# Patient Record
Sex: Male | Born: 1965 | Race: White | Hispanic: No | Marital: Single | Smoking: Current every day smoker
Health system: Southern US, Community
[De-identification: ages and names within clinical notes are randomized; demographics above are authoritative.]

## PROBLEM LIST (undated history)

## (undated) DIAGNOSIS — A159 Respiratory tuberculosis unspecified: Secondary | ICD-10-CM

---

## 2014-07-04 ENCOUNTER — Emergency Department (HOSPITAL_COMMUNITY)
Admission: EM | Admit: 2014-07-04 | Discharge: 2014-07-04 | Disposition: A | Discharge status: Home / Self Care | Payer: Self-pay | Attending: Emergency Medicine | Admitting: Emergency Medicine

## 2014-07-04 ENCOUNTER — Emergency Department (HOSPITAL_COMMUNITY): Payer: Self-pay

## 2014-07-04 ENCOUNTER — Encounter (HOSPITAL_COMMUNITY): Payer: Self-pay

## 2014-07-04 DIAGNOSIS — Y9301 Activity, walking, marching and hiking: Secondary | ICD-10-CM | POA: Insufficient documentation

## 2014-07-04 DIAGNOSIS — Z720 Tobacco use: Secondary | ICD-10-CM | POA: Insufficient documentation

## 2014-07-04 DIAGNOSIS — Z23 Encounter for immunization: Secondary | ICD-10-CM | POA: Insufficient documentation

## 2014-07-04 DIAGNOSIS — K047 Periapical abscess without sinus: Secondary | ICD-10-CM

## 2014-07-04 DIAGNOSIS — W01198A Fall on same level from slipping, tripping and stumbling with subsequent striking against other object, initial encounter: Secondary | ICD-10-CM | POA: Insufficient documentation

## 2014-07-04 DIAGNOSIS — Z8611 Personal history of tuberculosis: Secondary | ICD-10-CM | POA: Insufficient documentation

## 2014-07-04 DIAGNOSIS — Y92009 Unspecified place in unspecified non-institutional (private) residence as the place of occurrence of the external cause: Secondary | ICD-10-CM | POA: Insufficient documentation

## 2014-07-04 DIAGNOSIS — S01111A Laceration without foreign body of right eyelid and periocular area, initial encounter: Secondary | ICD-10-CM | POA: Insufficient documentation

## 2014-07-04 DIAGNOSIS — Y998 Other external cause status: Secondary | ICD-10-CM | POA: Insufficient documentation

## 2014-07-04 DIAGNOSIS — K046 Periapical abscess with sinus: Secondary | ICD-10-CM | POA: Insufficient documentation

## 2014-07-04 DIAGNOSIS — W002XXA Other fall from one level to another due to ice and snow, initial encounter: Secondary | ICD-10-CM | POA: Insufficient documentation

## 2014-07-04 HISTORY — DX: Respiratory tuberculosis unspecified: A15.9

## 2014-07-04 MED ORDER — PENICILLIN V POTASSIUM 500 MG PO TABS: 500.0000 mg | ORAL_TABLET | Freq: Four times a day (QID) | ORAL | Status: AC

## 2014-07-04 MED ORDER — PENICILLIN V POTASSIUM 500 MG PO TABS: 500.0000 mg | ORAL_TABLET | Freq: Four times a day (QID) | ORAL | Status: DC

## 2014-07-04 MED ORDER — TETRACAINE HCL 0.5 % OP SOLN
1.0000 [drp] | Freq: Once | OPHTHALMIC | Status: AC
  Administered 2014-07-04: 1 [drp] via OPHTHALMIC
  Filled 2014-07-04: qty 2

## 2014-07-04 MED ORDER — LIDOCAINE HCL (PF) 1 % IJ SOLN
5.0000 mL | Freq: Once | INTRAMUSCULAR | Status: AC
  Administered 2014-07-04: 5 mL
  Filled 2014-07-04: qty 5

## 2014-07-04 MED ORDER — BACITRACIN ZINC 500 UNIT/GM EX OINT: 1.0000 "application " | TOPICAL_OINTMENT | Freq: Two times a day (BID) | CUTANEOUS | Status: AC

## 2014-07-04 MED ORDER — TETANUS-DIPHTH-ACELL PERTUSSIS 5-2.5-18.5 LF-MCG/0.5 IM SUSP
0.5000 mL | Freq: Once | INTRAMUSCULAR | Status: AC
  Administered 2014-07-04: 0.5 mL via INTRAMUSCULAR
  Filled 2014-07-04: qty 0.5

## 2014-07-04 NOTE — ED Provider Notes (Signed)
CSN: [id number]     Arrival date & time 07/04/14  2037 History  This chart was scribed for non-physician practitioner, Antony Madura, PA-C working with Layla Maw Ward, DO by Freida Busman, ED Scribe. This patient was seen in room WTR7/WTR7 and the patient's care was started at 9:06 PM.    Chief Complaint  Patient presents with  . Facial Laceration    The history is provided by the patient. No language interpreter was used.    HPI Comments:  Ryan Hurley is a 49 y.o. male who presents to the Emergency Department s/p fall this evening complaining of a laceration to under his right eye following the incident. He reports moderate constant pain surrounding the site. He denies eye pain, vision changes, LOC, and epistaxis. Pt states he slipped on ice and fell  hitting his face in the concrete about 1 hour PTA. No alleviating factors noted. Pt is unsure of tetanus status.   Past Medical History  Diagnosis Date  . Tuberculosis    History reviewed. No pertinent past surgical history. No family history on file. History  Substance Use Topics  . Smoking status: Current Every Day Smoker  . Smokeless tobacco: Not on file  . Alcohol Use: Yes     Comment: rarely     Review of Systems  Eyes: Negative for pain and visual disturbance.  Skin: Positive for wound.  All other systems reviewed and are negative.   Allergies  Review of patient's allergies indicates no known allergies.  Home Medications   Prior to Admission medications   Medication Sig Start Date End Date Taking? Authorizing Provider  acetaminophen (TYLENOL) 500 MG tablet Take 500 mg by mouth every 6 (six) hours as needed for mild pain.   Yes Historical Provider, MD  ibuprofen (ADVIL,MOTRIN) 200 MG tablet Take 200 mg by mouth every 6 (six) hours as needed for moderate pain.   Yes Historical Provider, MD  bacitracin ointment Apply 1 application topically 2 (two) times daily. 07/04/14   Antony Madura, PA-C  penicillin v potassium (VEETID)  500 MG tablet Take 1 tablet (500 mg total) by mouth 4 (four) times daily. 07/04/14 07/11/14  Antony Madura, PA-C   BP 128/80 mmHg  Pulse 71  Temp(Src) 97.6 F (36.4 C) (Oral)  Resp 16  SpO2 99%   Physical Exam  Constitutional: He is oriented to person, place, and time. He appears well-developed and well-nourished. No distress.  Nontoxic/nonseptic appearing  HENT:  Head: Normocephalic.  Nose: No sinus tenderness.    Mouth/Throat: Oropharynx is clear and moist. No oropharyngeal exudate.  Bilateral nares patent. Patient noted to have multiple dental caries.  Eyes: Conjunctivae and EOM are normal. Pupils are equal, round, and reactive to light. No foreign body present in the right eye. No foreign body present in the left eye. Right conjunctiva is not injected. Left conjunctiva is not injected. No scleral icterus.  Slit lamp exam:      The right eye shows no corneal abrasion, no corneal flare, no corneal ulcer and no hyphema.    Pain with eye movement to the right. Full EOMs noted. No fluorescein uptake on staining; no evidence of corneal abrasion or ulcer. No FB visualized. Negative Sidel's sign. No proptosis or hyphema  1.5 cm laceration to lower right eyelid; see diagram  Neck: Normal range of motion.  Pulmonary/Chest: Effort normal. No respiratory distress.  Respirations even and unlabored  Musculoskeletal: Normal range of motion.  Neurological: He is alert and oriented to person, place,  and time. No cranial nerve deficit. He exhibits normal muscle tone. Coordination normal.  GCS 15. Speech is goal oriented. No focal neurologic deficits appreciated. Patient ambulatory with steady gait.  Skin: Skin is warm and dry. No rash noted. He is not diaphoretic. No erythema. No pallor.  Psychiatric: He has a normal mood and affect. His behavior is normal.  Nursing note and vitals reviewed.   ED Course  Procedures   DIAGNOSTIC STUDIES:  Oxygen Saturation is 100% on RA, normal by my  interpretation.    COORDINATION OF CARE:  9:11 PM Will order facial CT. Discussed plan with pt at bedside and pt agreed to plan. 10:25 PM Pt updated with CT results.   LACERATION REPAIR PROCEDURE NOTE The patient's identification was confirmed and consent was obtained. This procedure was performed by Antony Madura, PA-C at 10:28 PM. Site: lower right eyelid Sterile procedures observed: yes  Anesthetic used (type and amt): less than 1 cc 1% lidocaine  Suture type/size: 5-0 prolene  Length: 1.5 cm  # of Sutures: 2 Technique: simple interrupted  Complexity: complex Tetanus ordered Site anesthetized, irrigated with NS, explored without evidence of foreign body, wound well approximated, site covered with dry, sterile dressing.  Patient tolerated procedure well without complications. Instructions for care discussed verbally and patient provided with additional written instructions for homecare and f/u.  Labs Review Labs Reviewed - No data to display  Imaging Review Ct Maxillofacial Wo Cm  07/04/2014   CLINICAL DATA:  Fall, laceration under right orbit. Pain at the injury site.  EXAM: CT MAXILLOFACIAL WITHOUT CONTRAST  TECHNIQUE: Multidetector CT imaging of the maxillofacial structures was performed. Multiplanar CT image reconstructions were also generated. A small metallic BB was placed on the right temple in order to reliably differentiate right from left.  COMPARISON:  None.  FINDINGS: Soft tissue swelling in the right infraorbital region. No evidence of orbital fracture. No evidence of sinus wall fracture. Mandible and zygomatic arches are intact. No air-fluid levels within the paranasal sinuses.  Multiple dental cavities. Lucency around numerous teeth including the right upper premolar and first molar as well as the left lower molars compatible with periapical abscesses.  Visualized airways patent.  IMPRESSION: No evidence of facial or orbital fracture.  Poor dentition with multiple dental  cavities and periapical abscesses.   Electronically Signed   By: Charlett Nose M.D.   On: 07/04/2014 22:05     EKG Interpretation None      MDM   Final diagnoses:  Right eyelid laceration, initial encounter  Abscess, periapical    49 year old male presents to the emergency department for further evaluation of laceration under the right eye secondary to a fall prior to arrival. No loss of consciousness from the fall. No focal neurologic deficits noted on exam. Tdap booster given. Pressure irrigation performed. Laceration occurred < 8 hours prior to repair which was well tolerated. Pt has no comorbidities to effect normal wound healing. Discussed suture home care with patient and answered questions. Pt to follow up for wound check and suture removal in 5-7 days. Pt is hemodynamically stable with no complaints prior to discharge. Patient agreeable to plan with no unaddressed concerns.  I personally performed the services described in this documentation, which was scribed in my presence. The recorded information has been reviewed and is accurate.   Filed Vitals:   07/04/14 2038 07/04/14 2327  BP: 114/85 128/80  Pulse: 65 71  Temp: 97.6 F (36.4 C)   TempSrc: Oral  Resp: 16 16  SpO2: 100% 99%     Antony MaduraKelly Jasmene Goswami, PA-C 07/04/14 2345  Layla MawKristen N Ward, DO 07/05/14 0008  Layla MawKristen N Ward, DO 07/05/14 2337

## 2014-07-04 NOTE — ED Notes (Signed)
Bed: WTR7 Expected date:  Expected time:  Means of arrival:  Comments: EMS 49 yo fall-lac under right eye-no LOC

## 2014-07-04 NOTE — Discharge Instructions (Signed)
Facial Laceration  A facial laceration is a cut on the face. These injuries can be painful and cause bleeding. Lacerations usually heal quickly, but they need special care to reduce scarring. DIAGNOSIS  Your health care provider will take a medical history, ask for details about how the injury occurred, and examine the wound to determine how deep the cut is. TREATMENT  Some facial lacerations may not require closure. Others may not be able to be closed because of an increased risk of infection. The risk of infection and the chance for successful closure will depend on various factors, including the amount of time since the injury occurred. The wound may be cleaned to help prevent infection. If closure is appropriate, pain medicines may be given if needed. Your health care provider will use stitches (sutures), wound glue (adhesive), or skin adhesive strips to repair the laceration. These tools bring the skin edges together to allow for faster healing and a better cosmetic outcome. If needed, you may also be given a tetanus shot. HOME CARE INSTRUCTIONS  Only take over-the-counter or prescription medicines as directed by your health care provider.  Follow your health care provider's instructions for wound care. These instructions will vary depending on the technique used for closing the wound. For Sutures:  Keep the wound clean and dry.   If you were given a bandage (dressing), you should change it at least once a day. Also change the dressing if it becomes wet or dirty, or as directed by your health care provider.   Wash the wound with soap and water 2 times a day. Rinse the wound off with water to remove all soap. Pat the wound dry with a clean towel.   After cleaning, apply a thin layer of the antibiotic ointment recommended by your health care provider. This will help prevent infection and keep the dressing from sticking.   You may shower as usual after the first 24 hours. Do not soak the  wound in water until the sutures are removed.   Get your sutures removed as directed by your health care provider. With facial lacerations, sutures should usually be taken out after 4-5 days to avoid stitch marks.   Wait a few days after your sutures are removed before applying any makeup. For Skin Adhesive Strips:  Keep the wound clean and dry.   Do not get the skin adhesive strips wet. You may bathe carefully, using caution to keep the wound dry.   If the wound gets wet, pat it dry with a clean towel.   Skin adhesive strips will fall off on their own. You may trim the strips as the wound heals. Do not remove skin adhesive strips that are still stuck to the wound. They will fall off in time.  For Wound Adhesive:  You may briefly wet your wound in the shower or bath. Do not soak or scrub the wound. Do not swim. Avoid periods of heavy sweating until the skin adhesive has fallen off on its own. After showering or bathing, gently pat the wound dry with a clean towel.   Do not apply liquid medicine, cream medicine, ointment medicine, or makeup to your wound while the skin adhesive is in place. This may loosen the film before your wound is healed.   If a dressing is placed over the wound, be careful not to apply tape directly over the skin adhesive. This may cause the adhesive to be pulled off before the wound is healed.   Avoid   prolonged exposure to sunlight or tanning lamps while the skin adhesive is in place.  The skin adhesive will usually remain in place for 5-10 days, then naturally fall off the skin. Do not pick at the adhesive film.  After Healing: Once the wound has healed, cover the wound with sunscreen during the day for 1 full year. This can help minimize scarring. Exposure to ultraviolet light in the first year will darken the scar. It can take 1-2 years for the scar to lose its redness and to heal completely.  SEEK IMMEDIATE MEDICAL CARE IF:  You have redness, pain, or  swelling around the wound.   You see ayellowish-white fluid (pus) coming from the wound.   You have chills or a fever.  MAKE SURE YOU:  Understand these instructions.  Will watch your condition.  Will get help right away if you are not doing well or get worse. Document Released: 06/27/2004 Document Revised: 03/10/2013 Document Reviewed: 12/31/2012 Exit[employer] Patient Information 2015 [employer]. This information is not intended to replace advice given to you by your health care provider. Make sure you discuss any questions you have with your health care provider.  

## 2014-07-04 NOTE — ED Notes (Signed)
Pt presents with c/o facial laceration under his right eye. Per EMS, pt is from [facility], pt was walking home and slipped on some ice. Pt denies neck or back pain, no LOC. Bleeding controlled at this time, ETOH on board.

## 2014-08-12 ENCOUNTER — Emergency Department (HOSPITAL_COMMUNITY): Payer: Self-pay

## 2014-08-12 ENCOUNTER — Emergency Department (HOSPITAL_COMMUNITY)
Admission: EM | Admit: 2014-08-12 | Discharge: 2014-08-13 | Disposition: A | Discharge status: Home / Self Care | Payer: Self-pay | Attending: Emergency Medicine | Admitting: Emergency Medicine

## 2014-08-12 ENCOUNTER — Encounter (HOSPITAL_COMMUNITY): Payer: Self-pay | Admitting: Emergency Medicine

## 2014-08-12 DIAGNOSIS — Z4802 Encounter for removal of sutures: Secondary | ICD-10-CM | POA: Insufficient documentation

## 2014-08-12 DIAGNOSIS — Z8611 Personal history of tuberculosis: Secondary | ICD-10-CM | POA: Insufficient documentation

## 2014-08-12 DIAGNOSIS — Z792 Long term (current) use of antibiotics: Secondary | ICD-10-CM | POA: Insufficient documentation

## 2014-08-12 DIAGNOSIS — J209 Acute bronchitis, unspecified: Secondary | ICD-10-CM | POA: Insufficient documentation

## 2014-08-12 DIAGNOSIS — J40 Bronchitis, not specified as acute or chronic: Secondary | ICD-10-CM

## 2014-08-12 DIAGNOSIS — Z720 Tobacco use: Secondary | ICD-10-CM | POA: Insufficient documentation

## 2014-08-12 NOTE — ED Provider Notes (Signed)
CSN: [id number]     Arrival date & time 08/12/14  2211 History  This chart was scribed for non-physician practitioner, Will Hans Rusher, PA-C working with No att. providers found by Angelene Giovanni, ED Scribe. The patient was seen in room TR11C/TR11C and the patient's care was started at 11:22 PM      Chief Complaint  Patient presents with  . Suture / Staple Removal   The history is provided by the patient. No language interpreter was used.   HPI Comments: Ryan Hurley is a 49 y.o. male who presents to the Emergency Department for a removal of sutures under right eye. He had his sutures on 07/04/14. He reports that he was working out of time and did not have time to get them removed. He denies any fevers, chills, or any visual disturbances.   He also reports of a productive cough with yellow sputum for a month. He reports a hx of TB several years ago that was treated and resolved. He reports wheezing at night. He denies SOB and abdominal pain. He reports that he is a smoker. He denies a hx of bronchitis or emphysema.   Past Medical History  Diagnosis Date  . Tuberculosis    History reviewed. No pertinent past surgical history. No family history on file. History  Substance Use Topics  . Smoking status: Current Every Day Smoker  . Smokeless tobacco: Not on file  . Alcohol Use: Yes     Comment: rarely     Review of Systems  Constitutional: Negative for fever and chills.  HENT: Negative for congestion, facial swelling, rhinorrhea, sneezing, sore throat and trouble swallowing.   Eyes: Negative for pain, discharge, redness, itching and visual disturbance.  Respiratory: Positive for cough and wheezing (at night). Negative for shortness of breath.   Cardiovascular: Negative for chest pain and palpitations.  Gastrointestinal: Negative for nausea, vomiting and abdominal pain.  Genitourinary: Negative for difficulty urinating.  Skin: Negative for rash and wound.  Neurological: Negative for  light-headedness and headaches.      Allergies  Review of patient's allergies indicates no known allergies.  Home Medications   Prior to Admission medications   Medication Sig Start Date End Date Taking? Authorizing Provider  acetaminophen (TYLENOL) 500 MG tablet Take 500 mg by mouth every 6 (six) hours as needed for mild pain.    Historical Provider, MD  azithromycin (ZITHROMAX Z-PAK) 250 MG tablet Take 1 tablet (250 mg total) by mouth daily.  PO day 1, then  PO days 205 08/13/14   Everlene Farrier, PA-C  bacitracin ointment Apply 1 application topically 2 (two) times daily. 07/04/14   Antony Madura, PA-C  dextromethorphan-guaiFENesin [facility] DM) 30-600 MG per 12 hr tablet Take 1 tablet by mouth 2 (two) times daily. 08/13/14   Everlene Farrier, PA-C  ibuprofen (ADVIL,MOTRIN) 200 MG tablet Take 200 mg by mouth every 6 (six) hours as needed for moderate pain.    Historical Provider, MD   BP 122/83 mmHg  Pulse 71  Temp(Src) 98.2 F (36.8 C) (Oral)  Resp 20  Ht  (1.727 m)  Wt 135 lb (61.236 kg)  BMI 20.53 kg/m2  SpO2 97% Physical Exam  Constitutional: He is oriented to person, place, and time. He appears well-developed and well-nourished. No distress.  HENT:  Head: Normocephalic and atraumatic.  Right Ear: External ear normal.  Left Ear: External ear normal.  Nose: Nose normal.  Mouth/Throat: Oropharynx is clear and moist. No oropharyngeal exudate.  Eyes: Conjunctivae are normal.  Pupils are equal, round, and reactive to light. Right eye exhibits no discharge. Left eye exhibits no discharge. No scleral icterus.  Neck: Normal range of motion. Neck supple. No tracheal deviation present.  Cardiovascular: Normal rate, regular rhythm, normal heart sounds and intact distal pulses.  Exam reveals no gallop and no friction rub.   No murmur heard. Pulmonary/Chest: Effort normal and breath sounds normal. No respiratory distress. He has no wheezes. He has no rales.  Abdominal: Soft.  There is no tenderness.  Musculoskeletal: Normal range of motion.  Lymphadenopathy:    He has no cervical adenopathy.  Neurological: He is alert and oriented to person, place, and time. Coordination normal.  Skin: Skin is warm and dry. No rash noted. He is not diaphoretic. No erythema. No pallor.  Psychiatric: He has a normal mood and affect. His behavior is normal.  Nursing note and vitals reviewed.   ED Course  SUTURE REMOVAL Date/Time: 08/12/2014 11:45 PM Performed by: Everlene Farrier Authorized by: Everlene Farrier Consent: Verbal consent obtained. Risks and benefits: risks, benefits and alternatives were discussed Consent given by: patient Patient understanding: patient states understanding of the procedure being performed Patient consent: the patient's understanding of the procedure matches consent given Procedure consent: procedure consent matches procedure scheduled Site marked: the operative site was marked Required items: required blood products, implants, devices, and special equipment available Patient identity confirmed: verbally with patient Time out: Immediately prior to procedure a "time out" was called to verify the correct patient, procedure, equipment, support staff and site/side marked as required. Body area: head/neck Location details: right cheek Wound Appearance: clean Sutures Removed: 2 Facility: sutures placed in this facility Patient tolerance: Patient tolerated the procedure well with no immediate complications   (including critical care time) DIAGNOSTIC STUDIES: Oxygen Saturation is 97% on RA, normal by my interpretation.    COORDINATION OF CARE: 11:29 PM- Pt advised of plan for treatment and pt agrees.    Labs Review Labs Reviewed - No data to display  Imaging Review Dg Chest 2 View  08/13/2014   CLINICAL DATA:  Relatively chronic cough, congestion, shortness of breath and chest pain. Initial encounter.  EXAM: CHEST  2 VIEW  COMPARISON:  None.   FINDINGS: The lungs are well-aerated. Scarring and bronchiectasis are seen at the right upper lobe, likely reflecting the patient's history of tuberculosis. There is no evidence of pleural effusion or pneumothorax.  The heart is normal in size; the mediastinal contour is within normal limits. No acute osseous abnormalities are seen.  IMPRESSION: Scarring and bronchiectasis at the right upper lobe may reflect the patient's history of tuberculosis. No acute superimposed focal airspace consolidation seen at this time, though underlying recurrent disease cannot be entirely excluded.   Electronically Signed   By: Roanna Raider M.D.   On: 08/13/2014 00:10     EKG Interpretation None      Filed Vitals:   08/12/14 2234  BP: 122/83  Pulse: 71  Temp: 98.2 F (36.8 C)  TempSrc: Oral  Resp: 20  Height:  (1.727 m)  Weight: 135 lb (61.236 kg)  SpO2: 97%     MDM   Meds given in ED:  Medications - No data to display  Discharge Medication List as of 08/13/2014 12:28 AM    START taking these medications   Details  azithromycin (ZITHROMAX Z-PAK) 250 MG tablet Take 1 tablet (250 mg total) by mouth daily.  PO day 1, then  PO days 205, Starting 08/13/2014, Until Discontinued,  Print    dextromethorphan-guaiFENesin (MUCINEX DM) 30-600 MG per 12 hr tablet Take 1 tablet by mouth 2 (two) times daily., Starting 08/13/2014, Until Discontinued, Print        Final diagnoses:  Encounter for removal of sutures  Bronchitis    Is a 49 year old male who presents the emergency department requesting suture removal and also complaining of a cough that is been ongoing for the past month. The patient purchase cough is worse at night. Patient denies any fevers or shortness of breath. Patient's lungs are clear to auscultation bilaterally. The patient is afebrile and nontoxic appearing. Patient's chest x-ray indicates scarring in bronchiectasis in the right upper lobe which is consistent with the  patient's history of tuberculosis. There is no acute superimposed focal airspace consolidation seen at this time. The patient is a smoker. Due to this complex history will prescribe a Z-Pak and Mucinex DM for his cough. I advised the patient to follow-up with their primary care provider this week. I advised the patient to return to the emergency department with new or worsening symptoms or new concerns. The patient verbalized understanding and agreement with plan.   I personally performed the services described in this documentation, which was scribed in my presence. The recorded information has been reviewed and is accurate.    Everlene FarrierWilliam Tayton Decaire, PA-C 08/13/14 0355  Richardean Canalavid H Yao, MD 08/13/14 [phone]

## 2014-08-12 NOTE — ED Notes (Signed)
Pt returned for removal of sutures under right eye.  St's he has been working out of town.  Pt also states he has had a cough for approx 1 month

## 2014-08-13 MED ORDER — AZITHROMYCIN 250 MG PO TABS: 250.0000 mg | ORAL_TABLET | Freq: Every day | ORAL | Status: AC

## 2014-08-13 MED ORDER — DM-GUAIFENESIN ER 30-600 MG PO TB12: 1.0000 | ORAL_TABLET | Freq: Two times a day (BID) | ORAL | Status: AC

## 2014-08-13 NOTE — Discharge Instructions (Signed)
Acute Bronchitis Bronchitis is inflammation of the airways that extend from the windpipe into the lungs (bronchi). The inflammation often causes mucus to develop. This leads to a cough, which is the most common symptom of bronchitis.  In acute bronchitis, the condition usually develops suddenly and goes away over time, usually in a couple weeks. Smoking, allergies, and asthma can make bronchitis worse. Repeated episodes of bronchitis may cause further lung problems.  CAUSES Acute bronchitis is most often caused by the same virus that causes a cold. The virus can spread from person to person (contagious) through coughing, sneezing, and touching contaminated objects. SIGNS AND SYMPTOMS   Cough.   Fever.   Coughing up mucus.   Body aches.   Chest congestion.   Chills.   Shortness of breath.   Sore throat.  DIAGNOSIS  Acute bronchitis is usually diagnosed through a physical exam. Your health care provider will also ask you questions about your medical history. Tests, such as chest X-rays, are sometimes done to rule out other conditions.  TREATMENT  Acute bronchitis usually goes away in a couple weeks. Oftentimes, no medical treatment is necessary. Medicines are sometimes given for relief of fever or cough. Antibiotic medicines are usually not needed but may be prescribed in certain situations. In some cases, an inhaler may be recommended to help reduce shortness of breath and control the cough. A cool mist vaporizer may also be used to help thin bronchial secretions and make it easier to clear the chest.  HOME CARE INSTRUCTIONS  Get plenty of rest.   Drink enough fluids to keep your urine clear or pale yellow (unless you have a medical condition that requires fluid restriction). Increasing fluids may help thin your respiratory secretions (sputum) and reduce chest congestion, and it will prevent dehydration.   Take medicines only as directed by your health care provider.  If  you were prescribed an antibiotic medicine, finish it all even if you start to feel better.  Avoid smoking and secondhand smoke. Exposure to cigarette smoke or irritating chemicals will make bronchitis worse. If you are a smoker, consider using nicotine gum or skin patches to help control withdrawal symptoms. Quitting smoking will help your lungs heal faster.   Reduce the chances of another bout of acute bronchitis by washing your hands frequently, avoiding people with cold symptoms, and trying not to touch your hands to your mouth, nose, or eyes.   Keep all follow-up visits as directed by your health care provider.  SEEK MEDICAL CARE IF: Your symptoms do not improve after 1 week of treatment.  SEEK IMMEDIATE MEDICAL CARE IF:  You develop an increased fever or chills.   You have chest pain.   You have severe shortness of breath.  You have bloody sputum.   You develop dehydration.  You faint or repeatedly feel like you are going to pass out.  You develop repeated vomiting.  You develop a severe headache. MAKE SURE YOU:   Understand these instructions.  Will watch your condition.  Will get help right away if you are not doing well or get worse. Document Released: 06/27/2004 Document Revised: 10/04/2013 Document Reviewed: 11/10/2012 [employer] Patient Information 2015 [employer]. This information is not intended to replace advice given to you by your health care provider. Make sure you discuss any questions you have with your health care provider. Cough, Adult  A cough is a reflex that helps clear your throat and airways. It can help heal the body or may be   a reaction to an irritated airway. A cough may only last 2 or 3 weeks (acute) or may last more than 8 weeks (chronic).  CAUSES Acute cough:  Viral or bacterial infections. Chronic cough:  Infections.  Allergies.  Asthma.  Post-nasal drip.  Smoking.  Heartburn or acid reflux.  Some medicines.  Chronic  lung problems (COPD).  Cancer. SYMPTOMS   Cough.  Fever.  Chest pain.  Increased breathing rate.  High-pitched whistling sound when breathing (wheezing).  Colored mucus that you cough up (sputum). TREATMENT   A bacterial cough may be treated with antibiotic medicine.  A viral cough must run its course and will not respond to antibiotics.  Your caregiver may recommend other treatments if you have a chronic cough. HOME CARE INSTRUCTIONS   Only take over-the-counter or prescription medicines for pain, discomfort, or fever as directed by your caregiver. Use cough suppressants only as directed by your caregiver.  Use a cold steam vaporizer or humidifier in your bedroom or home to help loosen secretions.  Sleep in a semi-upright position if your cough is worse at night.  Rest as needed.  Stop smoking if you smoke. SEEK IMMEDIATE MEDICAL CARE IF:   You have pus in your sputum.  Your cough starts to worsen.  You cannot control your cough with suppressants and are losing sleep.  You begin coughing up blood.  You have difficulty breathing.  You develop pain which is getting worse or is uncontrolled with medicine.  You have a fever. MAKE SURE YOU:   Understand these instructions.  Will watch your condition.  Will get help right away if you are not doing well or get worse. Document Released: 11/16/2010 Document Revised: 08/12/2011 Document Reviewed: 11/16/2010 [employer] Patient Information 2015 [employer]. This information is not intended to replace advice given to you by your health care provider. Make sure you discuss any questions you have with your health care provider.  

## 2016-06-03 IMAGING — CT CT MAXILLOFACIAL W/O CM
1 series · 16 of 30 positions shown, 20 images · non-contrast
Comparison: None.

CLINICAL DATA: Fall, laceration under right orbit. Pain at the
injury site.

EXAM:
CT MAXILLOFACIAL WITHOUT CONTRAST
TECHNIQUE: Multidetector CT imaging of the maxillofacial structures was
performed. Multiplanar CT image reconstructions were also generated.
A small metallic BB was placed on the right temple in order to
reliably differentiate right from left.

[Series 3: facial st · axial · 0.33mm/px · z∈[-152,+10]mm · 16 of 89 slices shown, 20 images]
[im 4/89  brain]
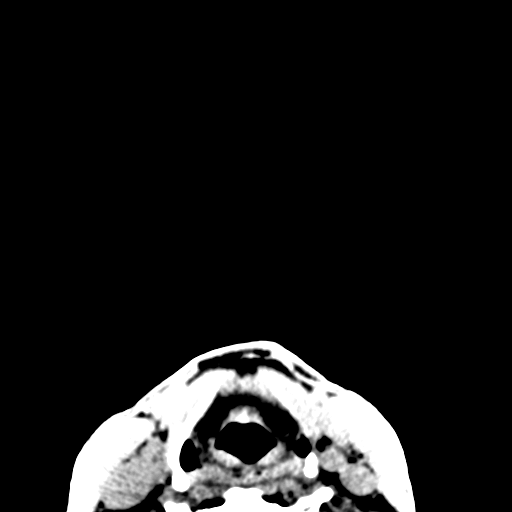
[im 4/89  bone]
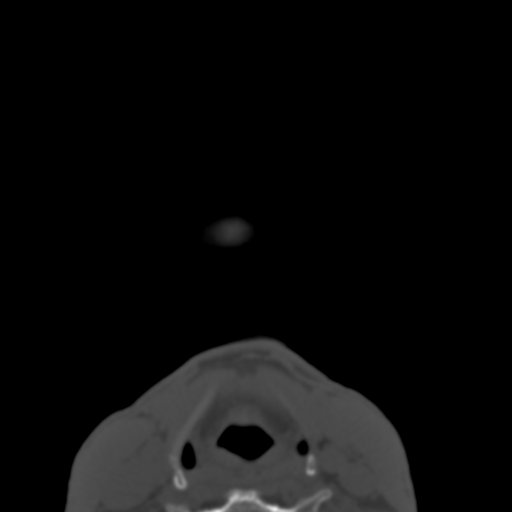
[im 10/89  bone]
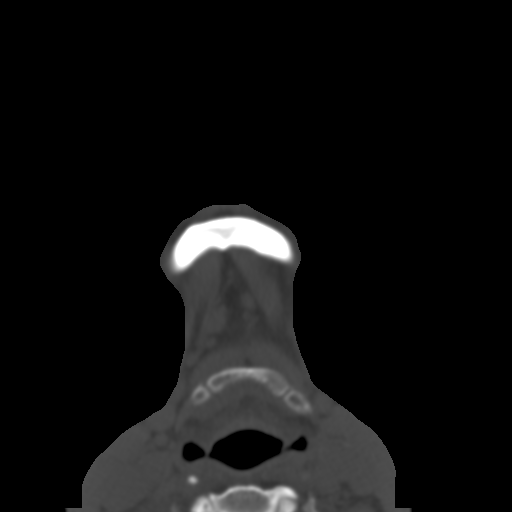
[im 16/89  bone]
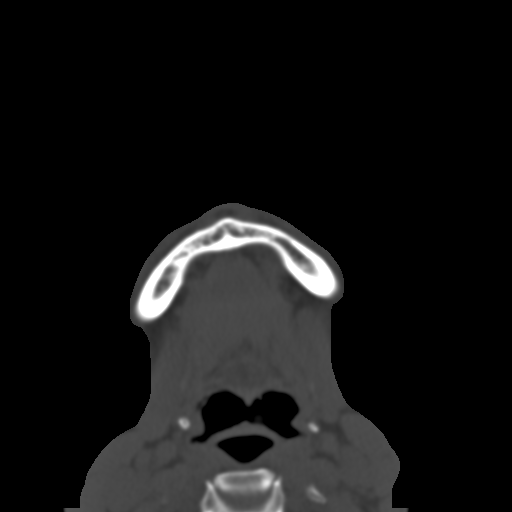
[im 22/89  bone]
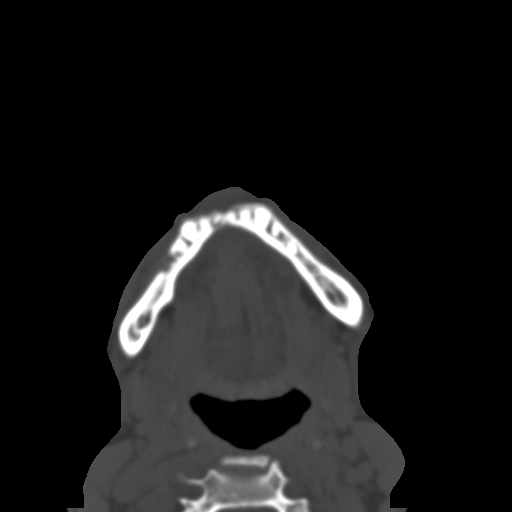
[im 25/89  brain]
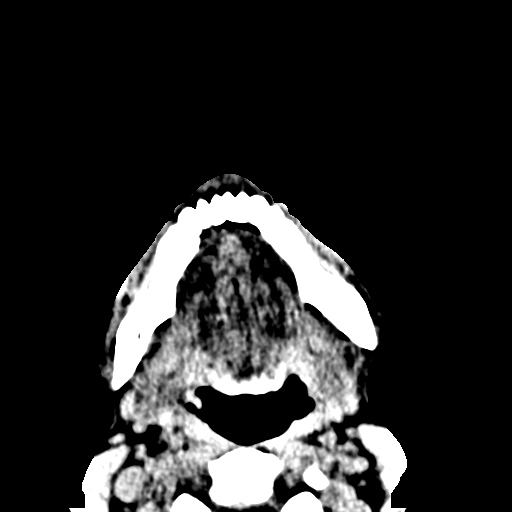
[im 25/89  bone]
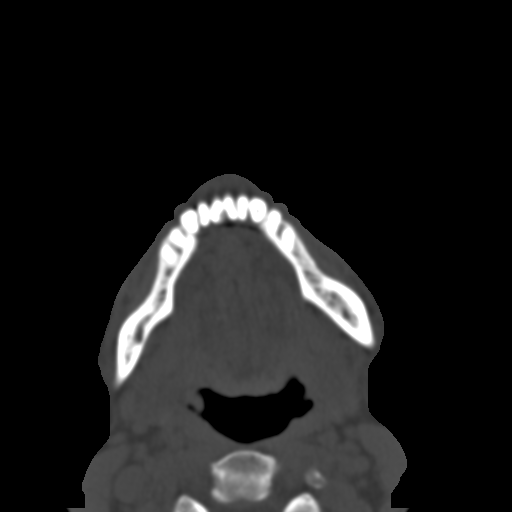
[im 31/89  bone]
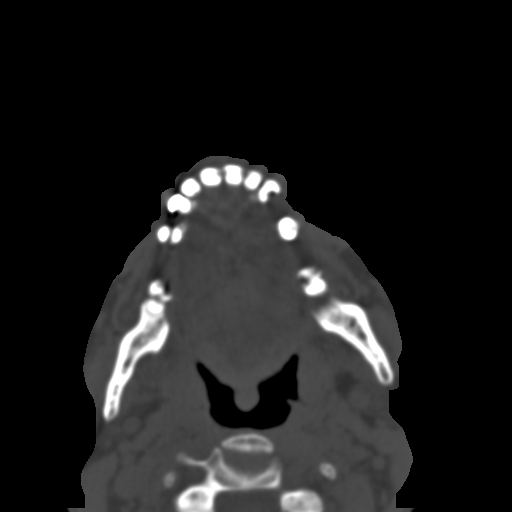
[im 37/89  bone]
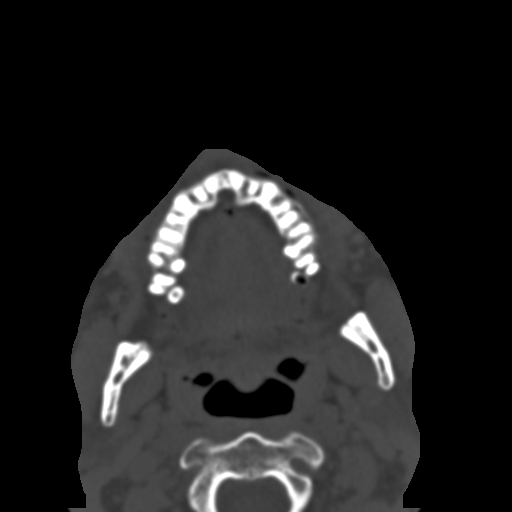
[im 43/89  bone]
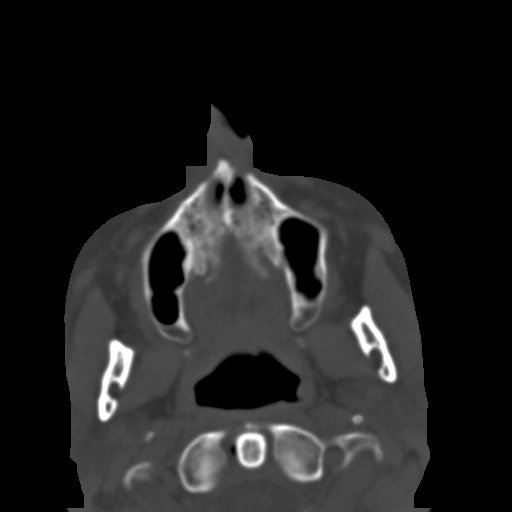
[im 46/89  brain]
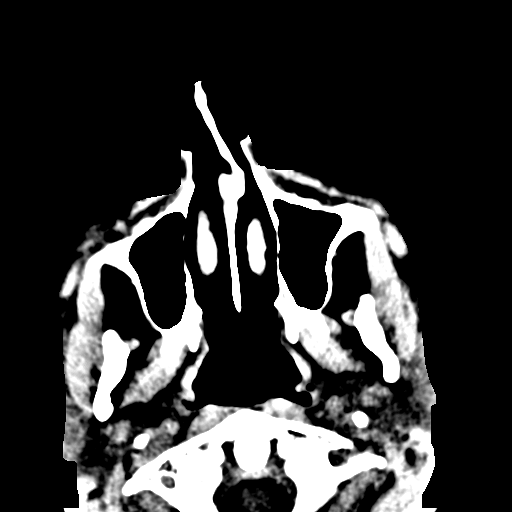
[im 46/89  bone]
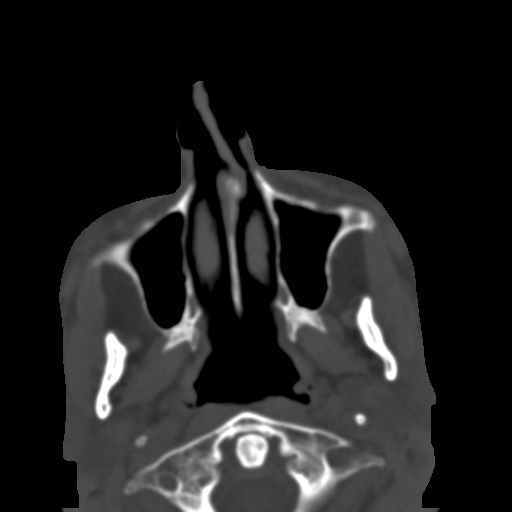
[im 52/89  bone]
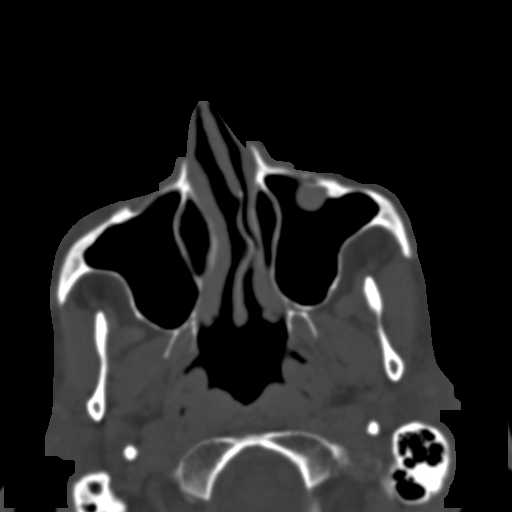
[im 58/89  bone]
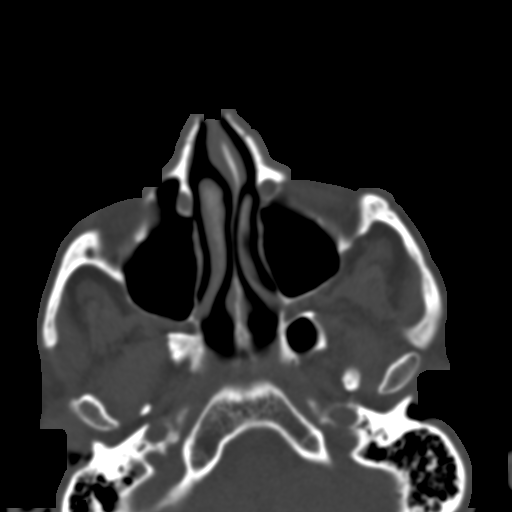
[im 64/89  bone]
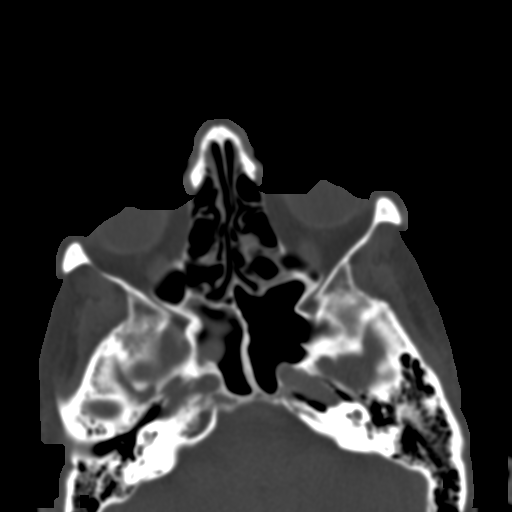
[im 67/89  brain]
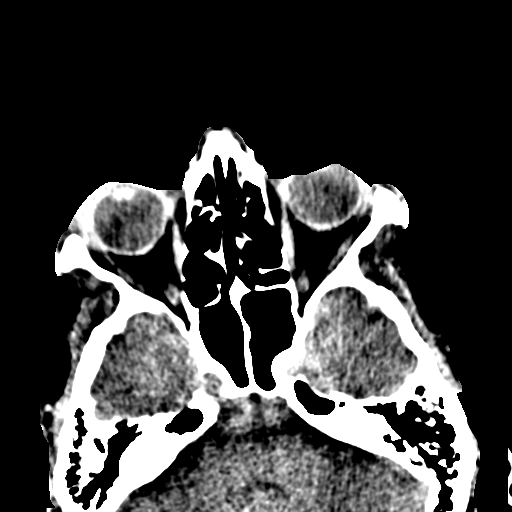
[im 67/89  bone]
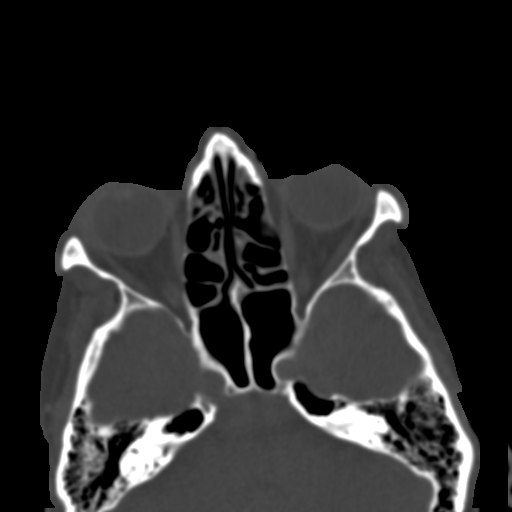
[im 73/89  bone]
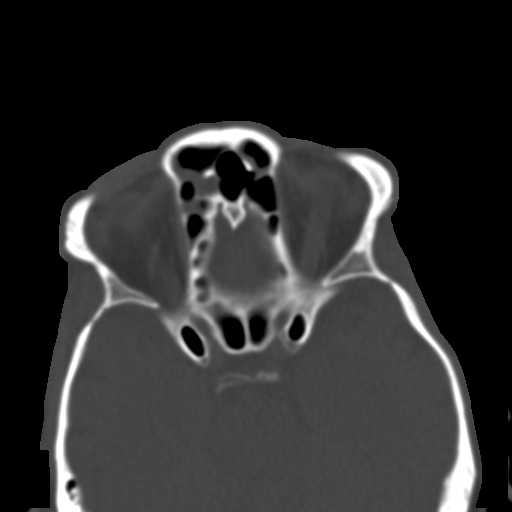
[im 79/89  bone]
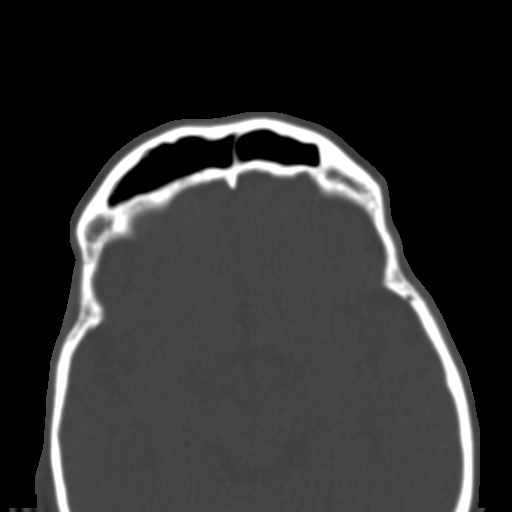
[im 85/89  bone]
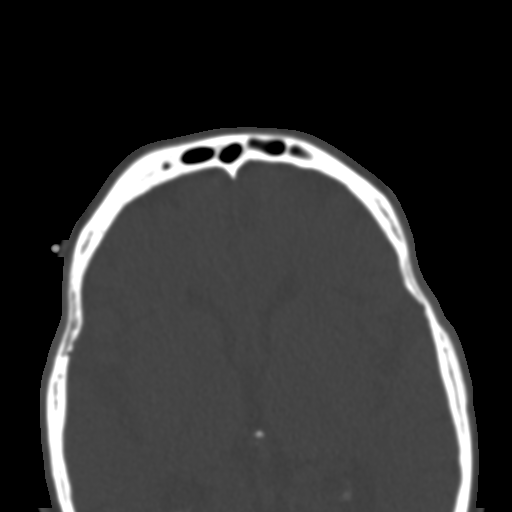

[16 of 30 positions shown; findings below may reference images not displayed]

FINDINGS: Soft tissue swelling in the right infraorbital region. No evidence
of orbital fracture. No evidence of sinus wall fracture. Mandible
and zygomatic arches are intact. No air-fluid levels within the
paranasal sinuses.

Multiple dental cavities. Lucency around numerous teeth including
the right upper premolar and first molar as well as the left lower
molars compatible with periapical abscesses.

Visualized airways patent.
IMPRESSION: No evidence of facial or orbital fracture.

Poor dentition with multiple dental cavities and periapical
abscesses.

## 2016-07-12 IMAGING — CR DG CHEST 2V
2 series · 2 of 2 positions shown · non-contrast
Comparison: None.

CLINICAL DATA: Relatively chronic cough, congestion, shortness of
breath and chest pain. Initial encounter.

EXAM:
CHEST  2 VIEW

[chest pa]
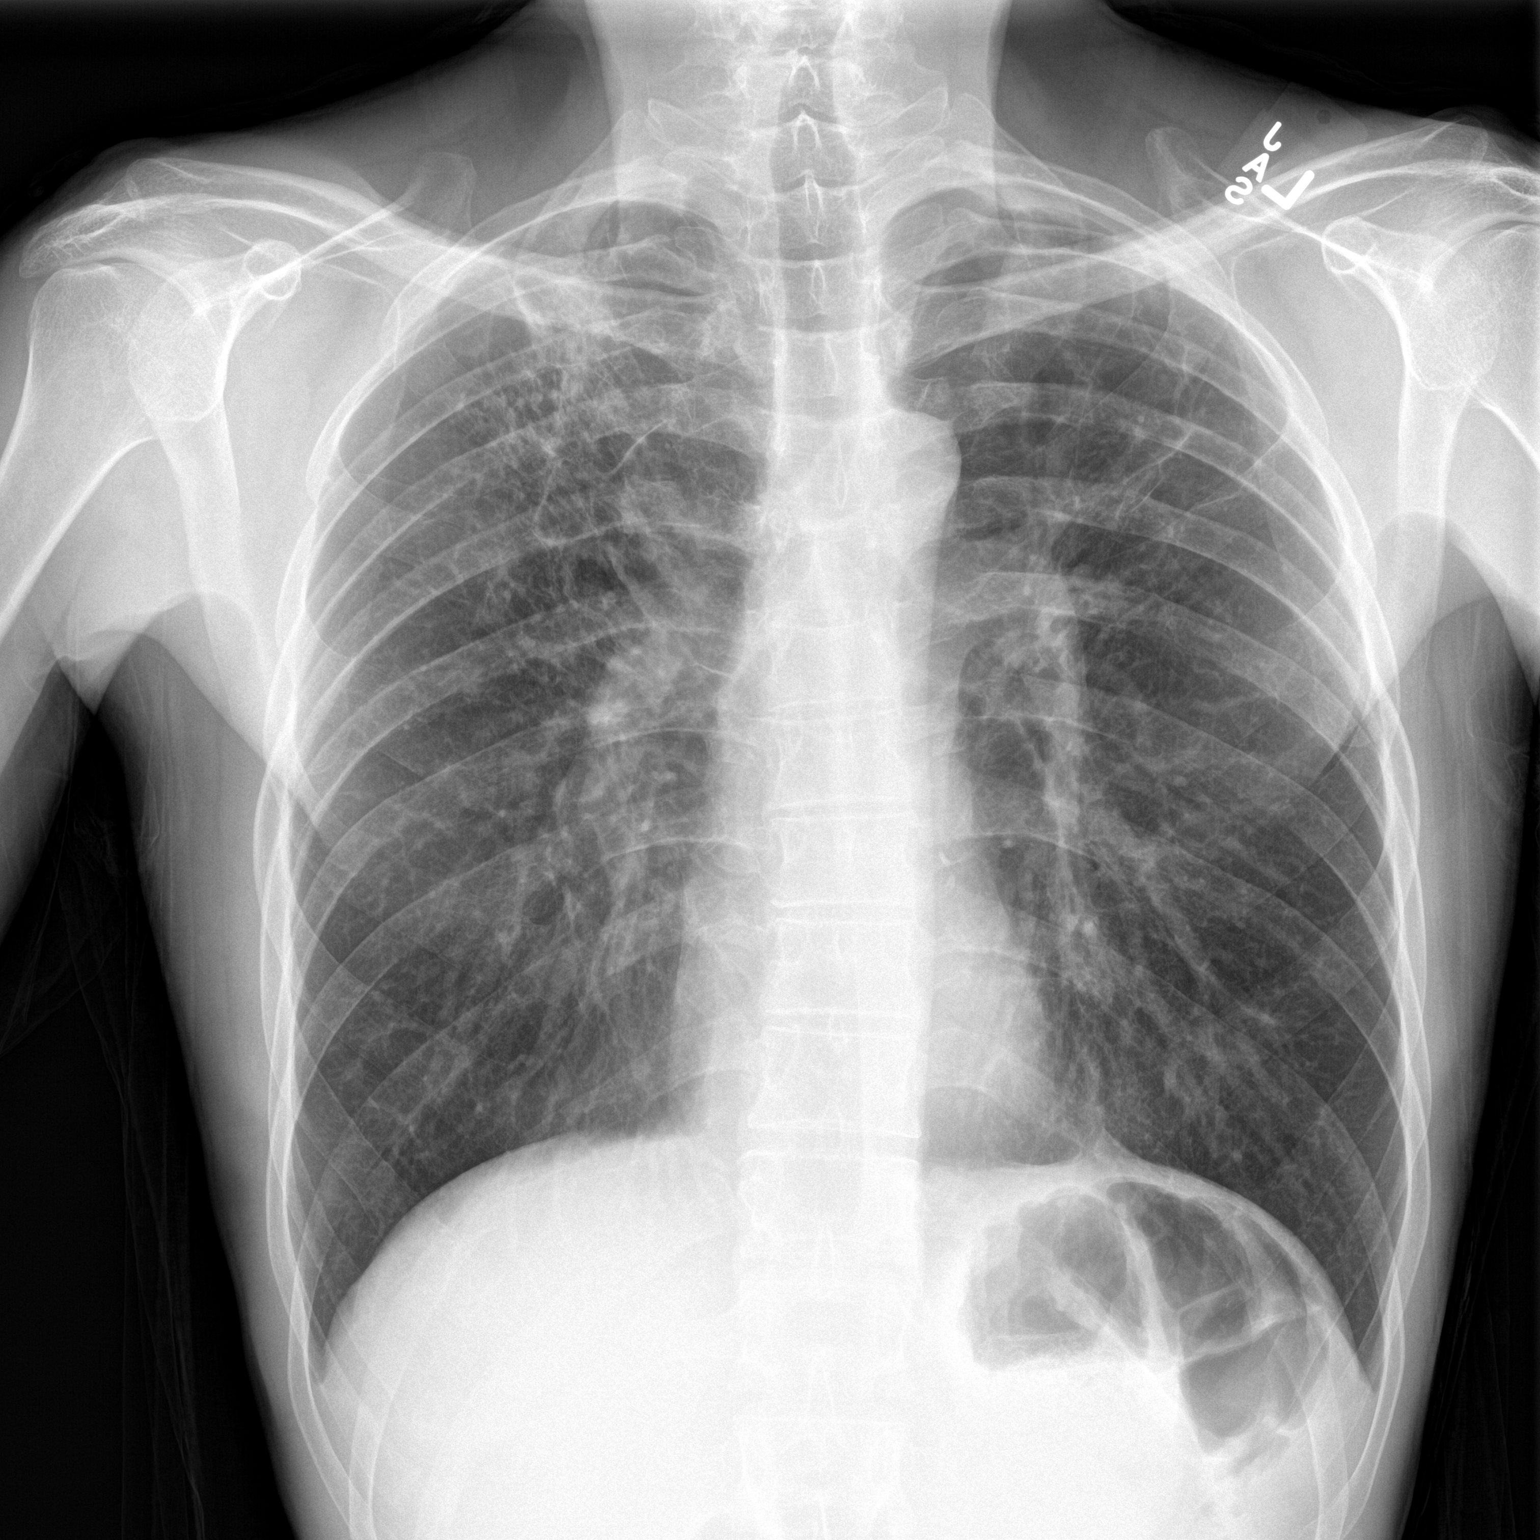

[chest lat]
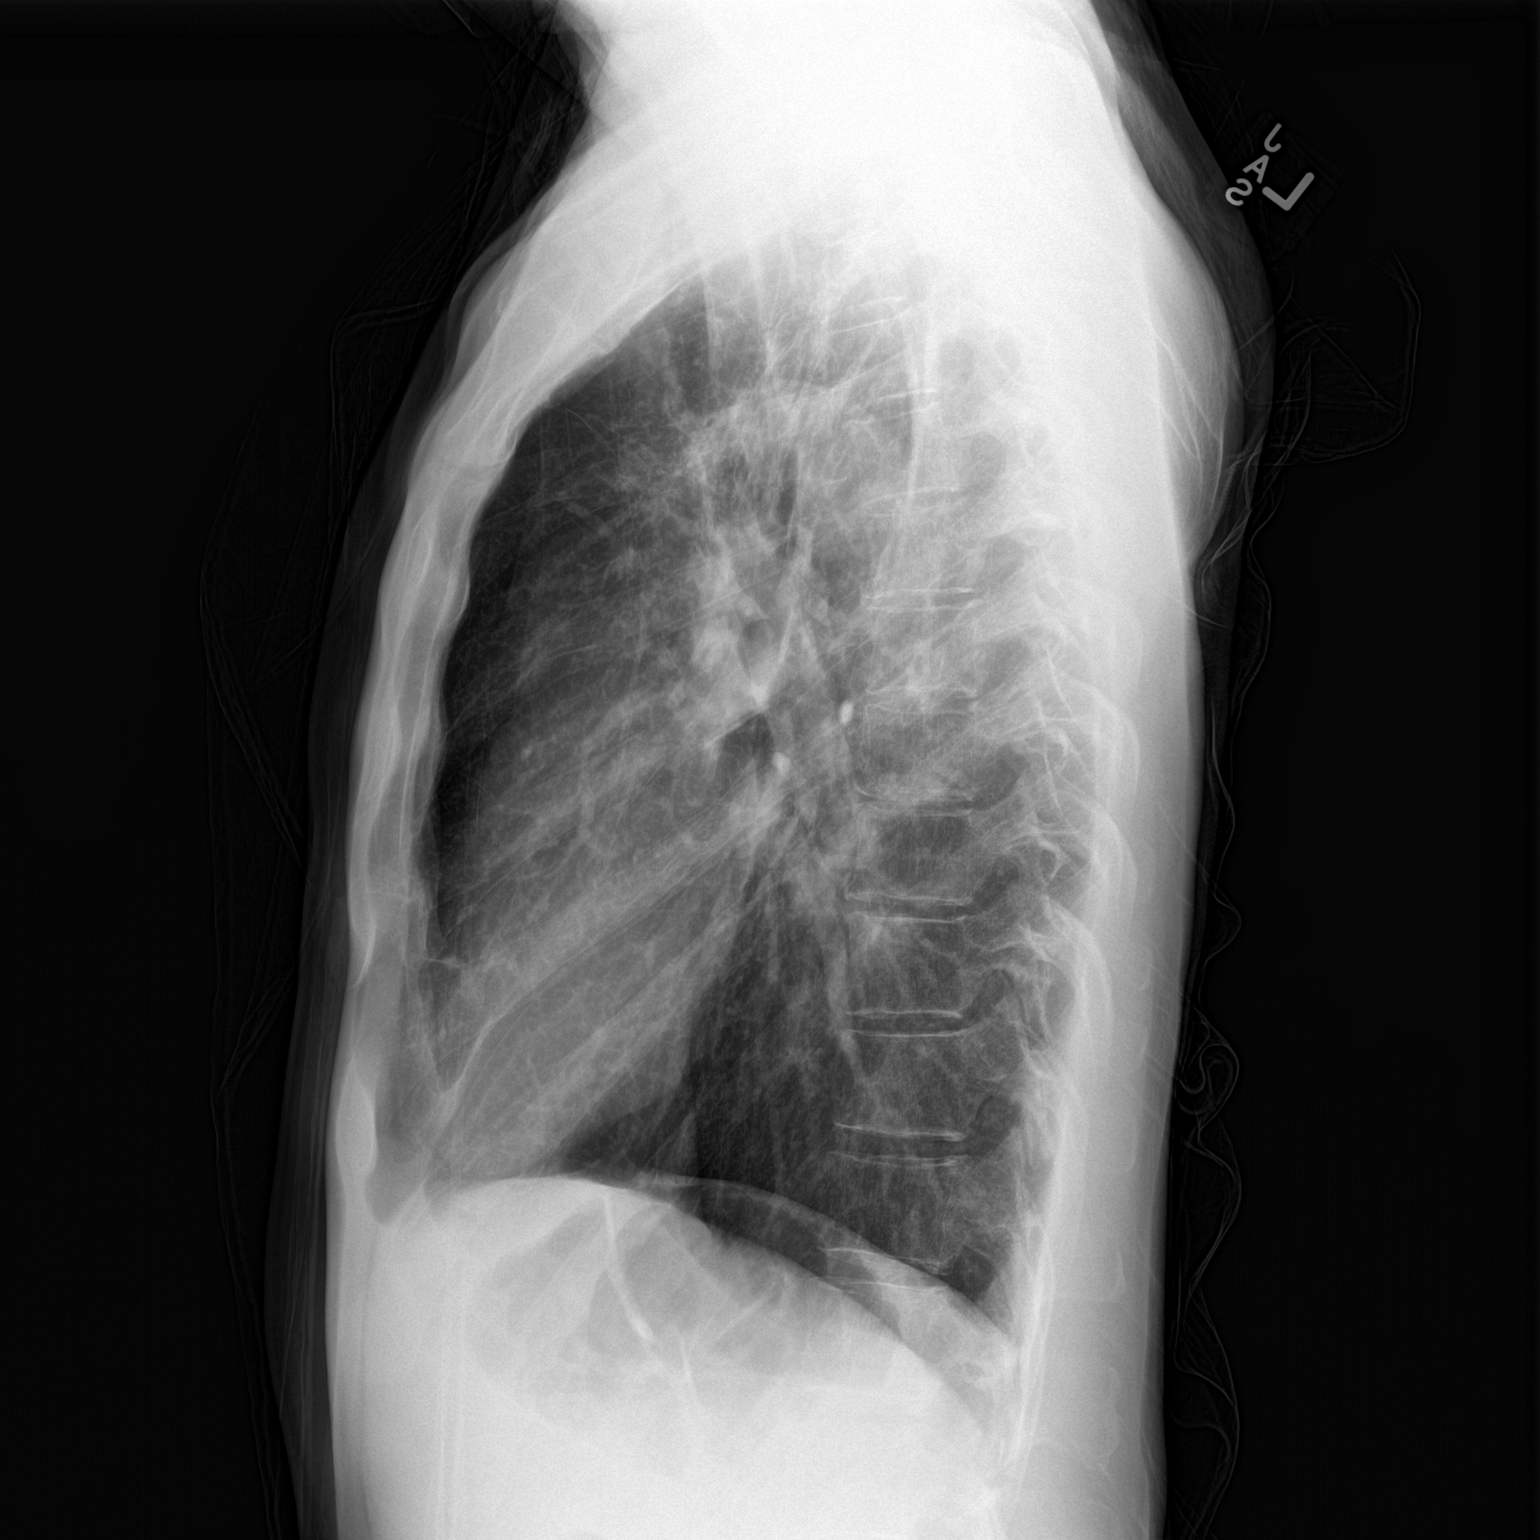

[2 of 2 positions shown; findings below may reference images not displayed]

FINDINGS: The lungs are well-aerated. Scarring and bronchiectasis are seen at
the right upper lobe, likely reflecting the patient's history of
tuberculosis. There is no evidence of pleural effusion or
pneumothorax.

The heart is normal in size; the mediastinal contour is within
normal limits. No acute osseous abnormalities are seen.
IMPRESSION: Scarring and bronchiectasis at the right upper lobe may reflect the
patient's history of tuberculosis. No acute superimposed focal
airspace consolidation seen at this time, though underlying
recurrent disease cannot be entirely excluded.
